# Patient Record
Sex: Female | Born: 1995 | Race: Black or African American | Hispanic: No | Marital: Single
Health system: Southern US, Community
[De-identification: ages and names within clinical notes are randomized; demographics above are authoritative.]

---

## 2021-02-05 ENCOUNTER — Emergency Department (HOSPITAL_COMMUNITY)
Admission: EM | Admit: 2021-02-05 | Discharge: 2021-02-05 | Disposition: A | Discharge status: Home / Self Care | Payer: Medicaid - Out of State | Attending: Emergency Medicine | Admitting: Emergency Medicine

## 2021-02-05 ENCOUNTER — Emergency Department (HOSPITAL_COMMUNITY): Payer: Medicaid - Out of State

## 2021-02-05 ENCOUNTER — Other Ambulatory Visit: Payer: Self-pay

## 2021-02-05 ENCOUNTER — Encounter (HOSPITAL_COMMUNITY): Payer: Self-pay | Admitting: Emergency Medicine

## 2021-02-05 DIAGNOSIS — R600 Localized edema: Secondary | ICD-10-CM | POA: Diagnosis not present

## 2021-02-05 DIAGNOSIS — W540XXA Bitten by dog, initial encounter: Secondary | ICD-10-CM | POA: Diagnosis not present

## 2021-02-05 DIAGNOSIS — T148XXA Other injury of unspecified body region, initial encounter: Secondary | ICD-10-CM

## 2021-02-05 DIAGNOSIS — S81852A Open bite, left lower leg, initial encounter: Secondary | ICD-10-CM | POA: Insufficient documentation

## 2021-02-05 DIAGNOSIS — S8992XA Unspecified injury of left lower leg, initial encounter: Secondary | ICD-10-CM | POA: Diagnosis present

## 2021-02-05 MED ORDER — AMOXICILLIN-POT CLAVULANATE 875-125 MG PO TABS
1.0000 | ORAL_TABLET | Freq: Once | ORAL | Status: AC
  Administered 2021-02-05: 1 via ORAL
  Filled 2021-02-05: qty 1

## 2021-02-05 MED ORDER — TETANUS-DIPHTH-ACELL PERTUSSIS 5-2.5-18.5 LF-MCG/0.5 IM SUSY
0.5000 mL | PREFILLED_SYRINGE | Freq: Once | INTRAMUSCULAR | Status: DC
Start: 2021-02-05 — End: 2021-02-05
  Filled 2021-02-05: qty 0.5

## 2021-02-05 MED ORDER — AMOXICILLIN-POT CLAVULANATE 875-125 MG PO TABS: 1.0000 | ORAL_TABLET | Freq: Two times a day (BID) | ORAL | 0 refills | Status: AC

## 2021-02-05 NOTE — ED Notes (Signed)
Pt showed me a recent vaccination record and pt received Tdap vaccine in 2021, provider made aware, will not administer tdap vaccine here in ED

## 2021-02-05 NOTE — Discharge Instructions (Addendum)
Today we discussed role of rabies postexposure prophylactic treatment.  We discussed risks of the dog that bit you possibly having rabies along with that rabies is almost 100% fatal.  After this discussion you elected to not obtain rabies prophylaxis at this time.  Please make sure you follow-up with animal control in the place to ensure that the dogs are vaccinated.  Please take Ibuprofen (Advil, motrin) and Tylenol (acetaminophen) to relieve your pain.    You may take up to 600 MG (3 pills) of normal strength ibuprofen every 8 hours as needed.   You make take tylenol, up to 1,000 mg (two extra strength pills) every 8 hours as needed.   It is safe to take ibuprofen and tylenol at the same time as they work differently.   Do not take more than 3,000 mg tylenol in a 24 hour period (not more than one dose every 8 hours.  Please check all medication labels as many medications such as pain and cold medications may contain tylenol.  Do not drink alcohol while taking these medications.  Do not take other NSAID'S while taking ibuprofen (such as aleve or naproxen).  Please take ibuprofen with food to decrease stomach upset.  You may have diarrhea from the antibiotics.  It is very important that you continue to take the antibiotics even if you get diarrhea unless a medical professional tells you that you may stop taking them.  If you stop too early the bacteria you are being treated for will become stronger and you may need different, more powerful antibiotics that have more side effects and worsening diarrhea.  Please stay well hydrated and consider probiotics as they may decrease the severity of your diarrhea.  Please be aware that if you take any hormonal contraception (birth control pills, nexplanon, the ring, etc) that your birth control will not work while you are taking antibiotics and you need to use back up protection as directed on the birth control medication information insert.

## 2021-02-05 NOTE — ED Triage Notes (Signed)
Per pt, states she is visiting mom-was taking groceries out of car when neighbor's dog attacked her-bites to back of left calf and back of lower thigh-states police and animal control arrived but lady put her dogs in the car and left-not sure if dog has been vaccinated

## 2021-02-05 NOTE — ED Notes (Signed)
Patient transported to X-ray 

## 2021-02-05 NOTE — ED Provider Notes (Signed)
[facility]-EMERGENCY DEPT Provider Note   CSN: [id number] Arrival date & time: 02/05/21  1538     History Chief Complaint  Patient presents with  . Animal Bite    Erica Koch is a 25 y.o. female with no pertinent past medical history who presents today for evaluation of dog bite.  Her last tetanus shot was within the past 5 years.  She is not sure if the dogs are up-to-date on their vaccines however notes that they were pet dogs and that both police and animal control were called and involved prior to her arrival.  She states the only bites are on her left leg, denies any other injuries.  This occurred a few hours PTA. She cleaned the wound well PTA.   HPI     History reviewed. No pertinent past medical history.  There are no problems to display for this patient.   History reviewed. No pertinent surgical history.   OB History   No obstetric history on file.     No family history on file.     Home Medications Prior to Admission medications   Medication Sig Start Date End Date Taking? Authorizing Provider  amoxicillin-clavulanate (AUGMENTIN) 875-125 MG tablet Take 1 tablet by mouth every 12 (twelve) hours. 02/05/21  Yes Cristina Gong, PA-C    Allergies    Patient has no known allergies.  Review of Systems   Review of Systems  Constitutional: Negative for chills and fever.  Musculoskeletal:       Left leg pain  Skin: Positive for wound.  All other systems reviewed and are negative.   Physical Exam Updated Vital Signs BP (!) 141/88 (BP Location: Right Arm)   Pulse (!) 101   Temp 98.3 F (36.8 C) (Oral)   Resp 16   LMP 01/20/2021   SpO2 100%   Physical Exam Vitals and nursing note reviewed.  Constitutional:      General: She is not in acute distress.    Appearance: She is not ill-appearing.  HENT:     Head: Normocephalic.  Cardiovascular:     Rate and Rhythm: Normal rate.  Pulmonary:     Effort: Pulmonary effort  is normal. No respiratory distress.  Musculoskeletal:     Comments: Patient is able to bear weight on LLE.  Limited ROM at left knee due to pain. Localized TTP around bite wounds with out active drainage, subcutaneous crepitus or abnormal erythema.  Skin:    Comments: Please see clinical images.  On the posterior left calf there are multiple superficial abrasions.  There is edema on the left posterior knee with wounds consistent with reported dog bite. There is no active bleeding at this time and scabs there present.  Neurological:     Mental Status: She is alert.         ED Results / Procedures / Treatments   Labs (all labs ordered are listed, but only abnormal results are displayed) Labs Reviewed - No data to display  EKG None  Radiology DG Knee 2 Views Left  Result Date: 02/05/2021 CLINICAL DATA:  Dog bite evaluate for foreign body. The by to the posterior knee and posterior lower leg. EXAM: LEFT KNEE - 1-2 VIEW COMPARISON:  None. FINDINGS: No evidence of fracture, dislocation, or joint effusion. No evidence of arthropathy or other focal bone abnormality. No radiopaque foreign body or soft tissue air. Soft tissues are unremarkable. IMPRESSION: Negative. No radiopaque foreign body or soft tissue air. Electronically Signed  By: Narda Rutherford M.D.   On: 02/05/2021 17:03   DG Tibia/Fibula Left  Result Date: 02/05/2021 CLINICAL DATA:  Dog bite. Evaluate for foreign body. Dog bite to posterior knee and lower leg. EXAM: LEFT TIBIA AND FIBULA - 2 VIEW COMPARISON:  None. FINDINGS: Cortical margins of the tibia and fibular intact. There is no evidence of fracture or other focal bone lesions. Knee and ankle alignment are maintained. No radiopaque foreign body. No soft tissue air. Incidental phleboliths in the anterior soft tissues. IMPRESSION: No soft tissue air or radiopaque foreign body. No osseous abnormality. Electronically Signed   By: Narda Rutherford M.D.   On: 02/05/2021 17:04     Procedures Procedures   Medications Ordered in ED Medications  amoxicillin-clavulanate (AUGMENTIN) 875-125 MG per tablet 1 tablet (1 tablet Oral Given 02/05/21 1658)    ED Course  I have reviewed the triage vital signs and the nursing notes.  Pertinent labs & imaging results that were available during my care of the patient were reviewed by me and considered in my medical decision making (see chart for details).    MDM Rules/Calculators/A&P                         Patient is a 25 year old woman who presents today for evaluation after dog bite. We discussed at length rabies postexposure prophylaxis.  We discussed that rabies is fatal in nearly all cases, along with not having clear vaccination status on the dog's. We discussed risks of rabies versus vaccination. Patient made the informed decision to decline postexposure prophylaxis at this time, stating that she wished to allow time for animal control to follow-up with the dog owners to verify vaccination status.  We discussed that if she change her mind but it is better to get these vaccines sooner rather than later and she states her understanding.  Additionally she states her understanding that rabies is fatal in nearly all cases.    Her last Tdap was with in the past year.  We discussed role of imaging.  Given her pain x-rays were obtained to evaluate for retained foreign body including broken teeth or others without evidence of this.  Patient declined wound irrigation stating that she washed out prior to arrival and she has a scab currently in place.  She is given her first dose of Augmentin and prescription for Augmentin to continue at home.  OTC medications for pain discussed.  Patient blood pressure and pulse rate were both slightly high however I suspect that this was situational as patient was in no distress.   Return precautions were discussed with patient who states their understanding.  At the time of discharge patient  denied any unaddressed complaints or concerns.  Patient is agreeable for discharge home.  Note: Portions of this report may have been transcribed using voice recognition software. Every effort was made to ensure accuracy; however, inadvertent computerized transcription errors may be present  Final Clinical Impression(s) / ED Diagnoses Final diagnoses:  Animal bite    Rx / DC Orders ED Discharge Orders         Ordered    amoxicillin-clavulanate (AUGMENTIN) 875-125 MG tablet  Every 12 hours        02/05/21 1718           Cristina Gong, PA-C 02/05/21 1752    Tegeler, Canary Brim, MD 02/05/21 [phone]

## 2022-05-03 IMAGING — CR DG TIBIA/FIBULA 2V*L*
2 series · 2 of 2 positions shown · non-contrast
Comparison: None.

CLINICAL DATA: Dog bite. Evaluate for foreign body. Dog bite to
posterior knee and lower leg.

EXAM:
LEFT TIBIA AND FIBULA - 2 VIEW

[x tib-fib ap left]
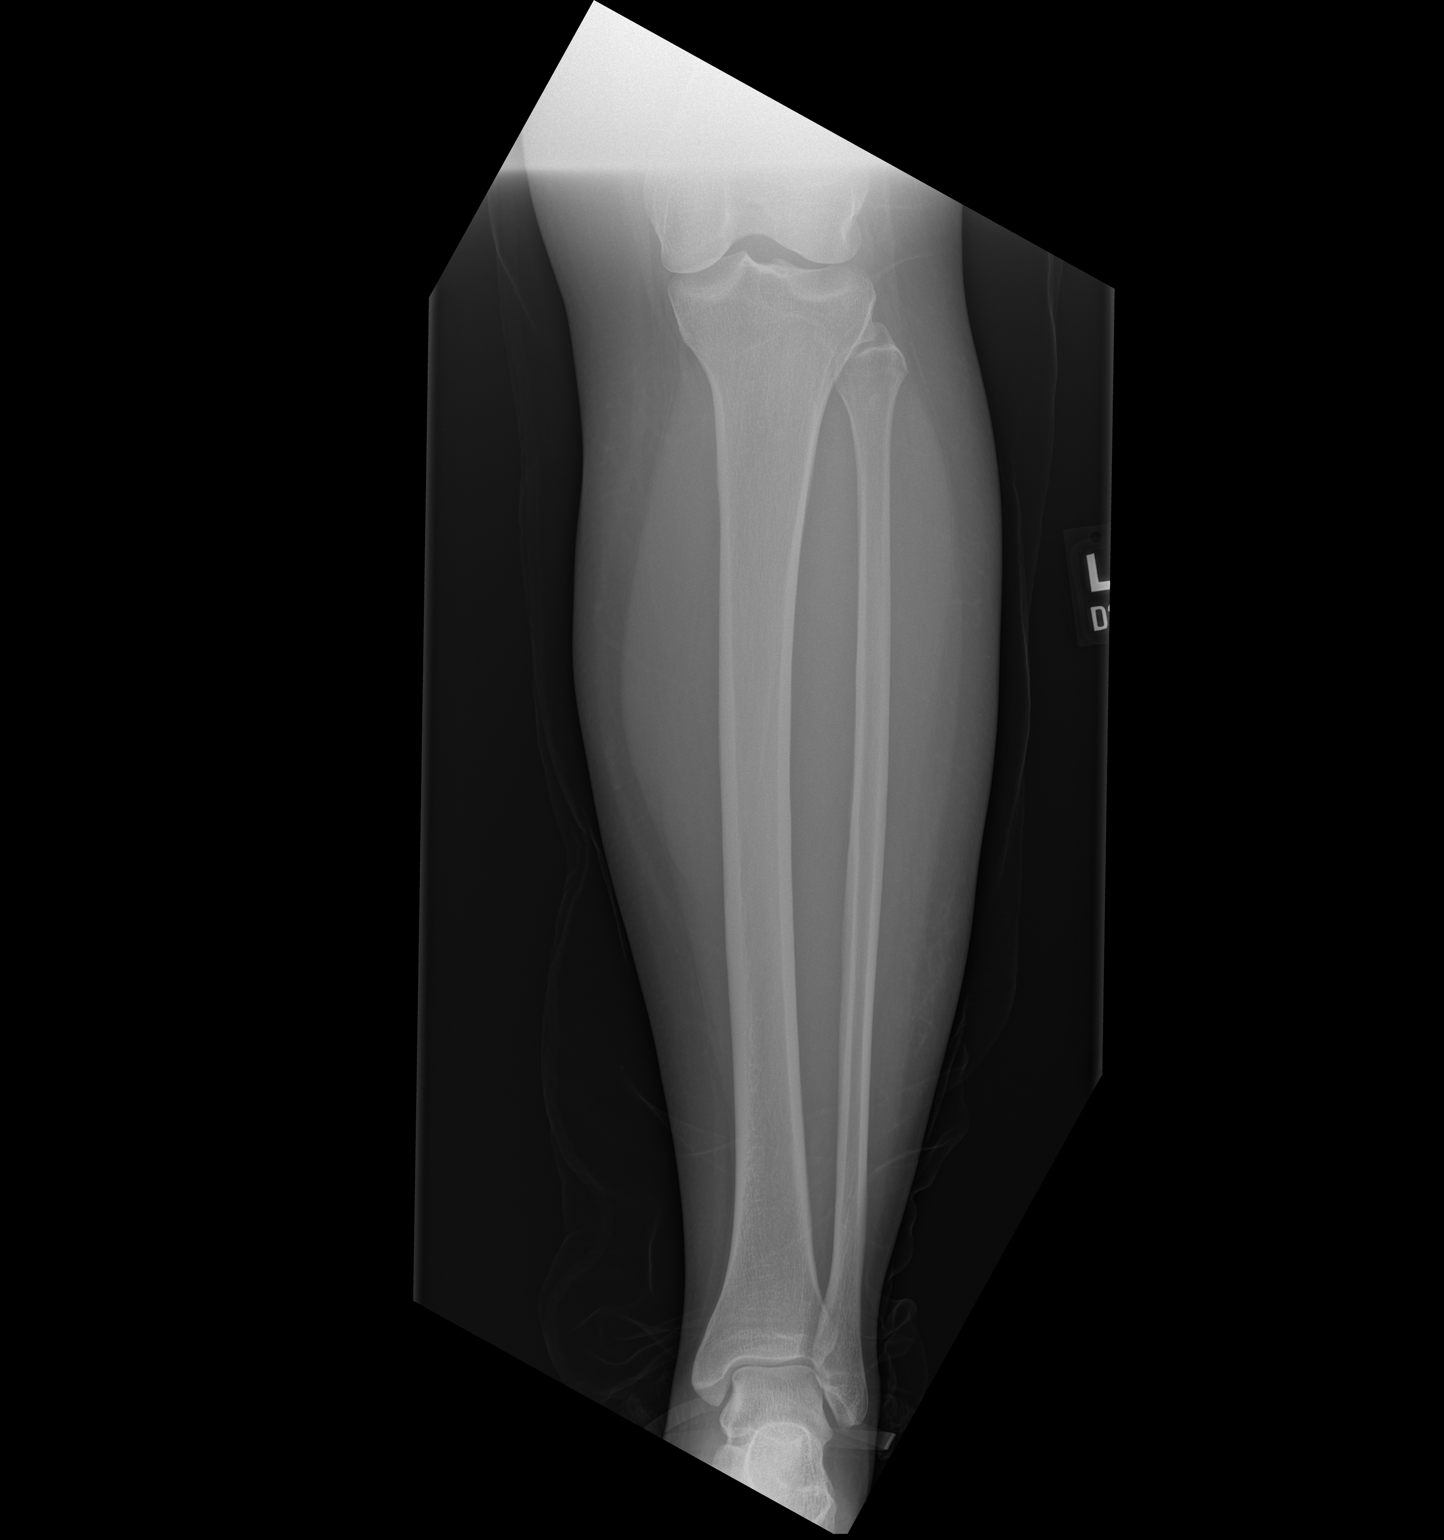

[x tib-fib lat left]
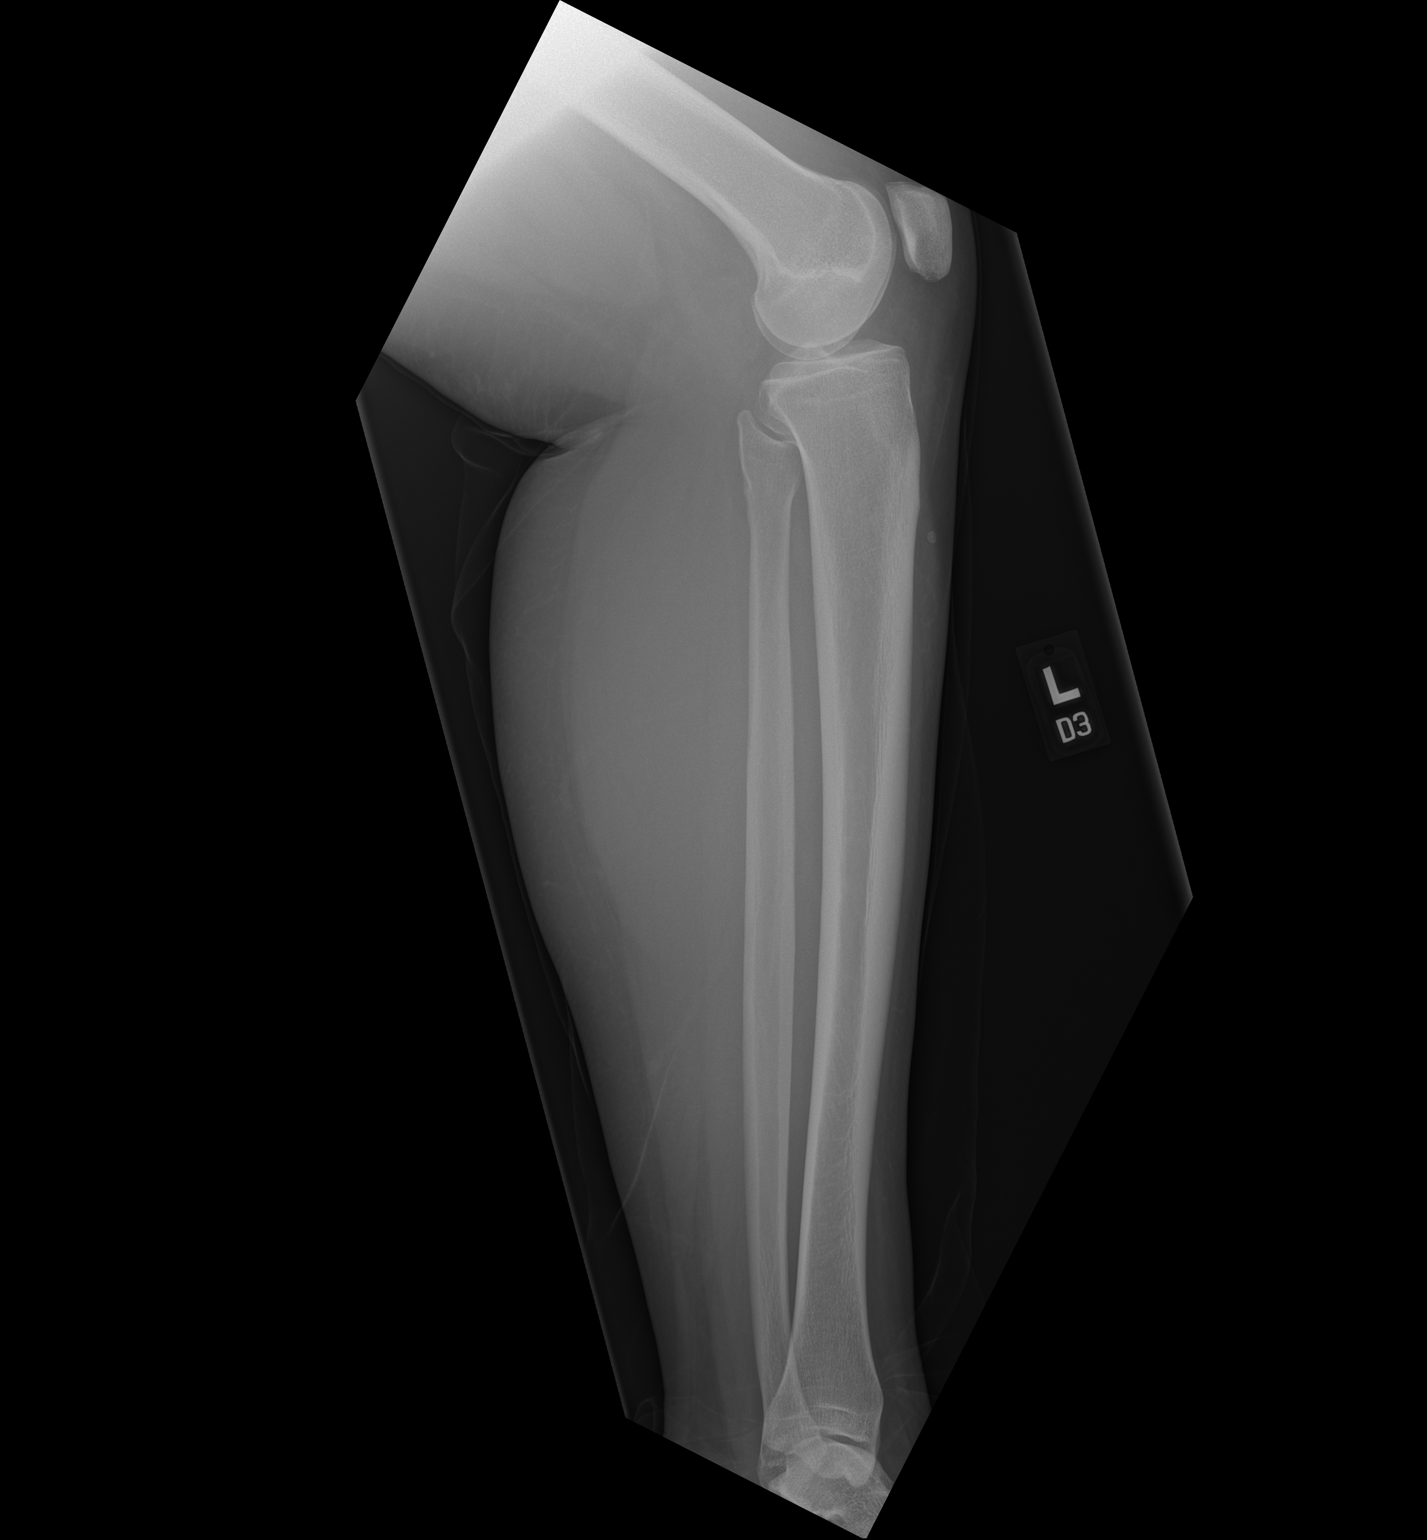

[2 of 2 positions shown; findings below may reference images not displayed]

FINDINGS: Cortical margins of the tibia and fibular intact. There is no
evidence of fracture or other focal bone lesions. Knee and ankle
alignment are maintained. No radiopaque foreign body. No soft tissue
air. Incidental phleboliths in the anterior soft tissues.
IMPRESSION: No soft tissue air or radiopaque foreign body. No osseous
abnormality.
